# Patient Record
Sex: Female | Born: 2000 | Hispanic: No | Marital: Single | Smoking: Never smoker
Health system: Southern US, Community
[De-identification: ages and names within clinical notes are randomized; demographics above are authoritative.]

## PROBLEM LIST (undated history)

## (undated) DIAGNOSIS — G43909 Migraine, unspecified, not intractable, without status migrainosus: Secondary | ICD-10-CM

## (undated) HISTORY — DX: Migraine, unspecified, not intractable, without status migrainosus: G43.909

---
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
## 2021-04-27 ENCOUNTER — Ambulatory Visit (INDEPENDENT_AMBULATORY_CARE_PROVIDER_SITE_OTHER): Payer: Medicaid Other | Admitting: Family

## 2021-04-27 ENCOUNTER — Other Ambulatory Visit: Payer: Self-pay

## 2021-04-27 ENCOUNTER — Encounter (INDEPENDENT_AMBULATORY_CARE_PROVIDER_SITE_OTHER): Payer: Self-pay | Admitting: Family

## 2021-04-27 VITALS — BP 102/63 | HR 86 | Wt 117.2 lb

## 2021-04-27 DIAGNOSIS — F411 Generalized anxiety disorder: Secondary | ICD-10-CM | POA: Diagnosis not present

## 2021-04-27 DIAGNOSIS — G44219 Episodic tension-type headache, not intractable: Secondary | ICD-10-CM | POA: Diagnosis not present

## 2021-04-27 DIAGNOSIS — R519 Headache, unspecified: Secondary | ICD-10-CM

## 2021-04-27 DIAGNOSIS — G43009 Migraine without aura, not intractable, without status migrainosus: Secondary | ICD-10-CM

## 2021-04-27 MED ORDER — TIZANIDINE HCL 2 MG PO TABS: ORAL_TABLET | ORAL | 5 refills | Status: AC

## 2021-04-27 MED ORDER — TROKENDI XR 100 MG PO CP24
ORAL_CAPSULE | ORAL | 5 refills | Status: AC
Start: 2021-04-27 — End: ?

## 2021-04-27 MED ORDER — RIZATRIPTAN BENZOATE 10 MG PO TABS: 10.0000 mg | ORAL_TABLET | ORAL | 5 refills | Status: AC | PRN

## 2021-04-29 ENCOUNTER — Encounter (INDEPENDENT_AMBULATORY_CARE_PROVIDER_SITE_OTHER): Payer: Self-pay | Admitting: Family

## 2021-04-29 NOTE — Progress Notes (Signed)
Erica Duran   MRN:  [id number]  07-07-2001   Provider: Elveria Rising NP-C Location of Care: [facility]  Visit type: Return visit  Last visit: 12/15/20  Referral source: Marcos Eke, PA-C History from: patient  Brief history:  Copied from previous record: History of migraine and tension headaches, orthostatic hypotension, situational depression. She is taking and tolerating Trokendi XR for migraine prevention and has experienced improvement in migraine frequency. She takes Ibuprofen and Rizatriptan for migraine relief. She has been seeing a therapist for mood.   Today's concerns: Erica Duran reports today that she has experienced fewer headaches since her last visit. She has enrolled in online college classes at [employer] and is doing well academically. She has not experienced dizziness and fainting since she has stopped drinking energy drinks. She has been working at consuming more water and regular meals, and says that she is sleeping well.   Erica Duran has been otherwise generally healthy since she was last seen. She has no other health concerns today other than previously mentioned.  Review of systems: Please see HPI for neurologic and other pertinent review of systems. Otherwise all other systems were reviewed and were negative.  Problem List: Patient Active Problem List   Diagnosis Date Noted   Nonintractable headache 12/18/2020   Anxiety disorder, unspecified 10/29/2019   Situational depression 09/05/2018   Orthostatic hypotension 03/04/2018   Migraine without aura and without status migrainosus, not intractable 08/11/2015   Episodic tension type headache 08/11/2015   Family history of migraine 08/11/2015     Past Medical History:  Diagnosis Date   Migraines     Past medical history comments: See HPI Copied from previous record: Birth History 5 lbs. 11 oz. infant born at [redacted] weeks gestational age to a 19 year old g 2 p 1 0 0 1 female. Gestation was  uncomplicated Mother received Epidural anesthesia   Repeat cesarean section Nursery Course was complicated by difficulty maintaining temperature Growth and Development was recalled as normal  Surgical history: No past surgical history on file.   Family history: family history includes Breast cancer in her maternal aunt and mother; Cancer in her mother; Diabetes in her maternal grandfather and maternal grandmother; Hyperlipidemia in her maternal grandmother; Liver disease in her maternal grandfather; Ovarian cancer in her maternal aunt; Rheum arthritis in her maternal grandmother; Thyroid disease in her maternal grandmother and mother.   Social history: Social History   Socioeconomic History   Marital status: Single    Spouse name: Not on file   Number of children: Not on file   Years of education: Not on file   Highest education level: Not on file  Occupational History   Not on file  Tobacco Use   Smoking status: Never    Passive exposure: Yes   Smokeless tobacco: Never   Tobacco comments:    Smoking inside and outside of home.  Vaping Use   Vaping Use: Never used  Substance and Sexual Activity   Alcohol use: No    Alcohol/week: 0.0 standard drinks   Drug use: No   Sexual activity: Never    Birth control/protection: I.U.D.    Comment: Mirena Inserted 2018  Other Topics Concern   Not on file  Social History Narrative   W[place]ene is not currently in school and is not employed.   She lives with her mother, her aunt, and she visits with father at-least once a week depending on his work schedule.    She enjoys  reading, sewing, and basketball.   Social Determinants of Health   Financial Resource Strain: Not on file  Food Insecurity: Not on file  Transportation Needs: Not on file  Physical Activity: Not on file  Stress: Not on file  Social Connections: Not on file  Intimate Partner Violence: Not on file    Past/failed meds: Copied from previous record: Sumatriptan did  not give relief of migraines   Allergies: No Known Allergies   Immunizations: Immunization History  Administered Date(s) Administered   PFIZER(Purple Top)SARS-COV-2 Vaccination 03/12/2020, 04/02/2020    Diagnostics/Screenings:  Physical Exam: BP 102/63   Pulse 86   Wt 117 lb 3.2 oz (53.2 kg)   BMI 18.77 kg/m   General: Well developed, well nourished, seated, in no evident distress Head: Head normocephalic and atraumatic.  Oropharynx benign. Neck: Supple Cardiovascular: Regular rate and rhythm, no murmurs Respiratory: Breath sounds clear to auscultation Musculoskeletal: No obvious deformities or scoliosis Skin: No rashes or neurocutaneous lesions  Neurologic Exam Mental Status: Awake and fully alert.  Oriented to place and time.  Recent and remote memory intact.  Attention span, concentration, and fund of knowledge appropriate.  Mood and affect appropriate. Cranial Nerves: Fundoscopic exam reveals sharp disc margins.  Pupils equal, briskly reactive to light.  Extraocular movements full without nystagmus.  Visual fields full to confrontation.  Hearing intact and symmetric to finger rub.  Facial sensation intact.  Face tongue, palate move normally and symmetrically.  Neck flexion and extension normal. Motor: Normal bulk and tone. Normal strength in all tested extremity muscles. Sensory: Intact to touch and temperature in all extremities.  Coordination: Rapid alternating movements normal in all extremities.  Finger-to-nose and heel-to shin performed accurately bilaterally.  Romberg negative. Gait and Station: Arises from chair without difficulty.  Stance is normal. Gait demonstrates normal stride length and balance.   Able to heel, toe and tandem walk without difficulty. Reflexes: 1+ and symmetric. Toes downgoing.   Impression: Migraine without aura and without status migrainosus, not intractable - Plan: TROKENDI XR 100 MG CP24, tiZANidine (ZANAFLEX) 2 MG tablet, rizatriptan (MAXALT)  10 MG tablet  Nonintractable headache, unspecified chronicity pattern, unspecified headache type - Plan: tiZANidine (ZANAFLEX) 2 MG tablet  Episodic tension-type headache, not intractable  Generalized anxiety disorder   Recommendations for plan of care: The patient's previous [facility] were reviewed. Ceonna has neither had nor required imaging or lab studies since the last visit. She is taking and tolerating Trokendi XR for migraine prevention and is doing well at this time. I reminded Erica Duran of the need for her to drink plenty of water each day, to consume regular meals and to get at least 8 hours of sleep each night as these things are known to help reduce how often headaches occur. I will see Erica Duran back in follow up in 6 months or sooner if needed.   The medication list was reviewed and reconciled. No changes were made in the prescribed medications today. A complete medication list was provided to the patient.  Return in about 6 months (around 10/25/2021).   Allergies as of 04/27/2021   No Known Allergies      Medication List        Accurate as of April 27, 2021 11:59 PM. If you have any questions, ask your nurse or doctor.          levonorgestrel 20 MCG/24HR IUD Commonly known as: MIRENA 1 each by Intrauterine route once.   ondansetron 4 MG disintegrating tablet Commonly known  as: Zofran ODT Take 1 tablet at onset of nausea. May repeat every 8 hours as needed   promethazine 12.5 MG tablet Commonly known as: PHENERGAN Take 1 tablet at onset of nausea. May repeat every 6 hours as needed   rizatriptan 10 MG tablet Commonly known as: Maxalt Take 1 tablet (10 mg total) by mouth as needed for migraine. May repeat in 2 hours if needed   tiZANidine 2 MG tablet Commonly known as: ZANAFLEX TAKE 1 TABLET BY MOUTH AT BEDTIME FOR PAIN THAT  KEEPS  YOU  AWAKE   Trokendi XR 100 MG Cp24 Generic drug: Topiramate ER Take 1 capsule at bedtime        Total time spent  with the patient was 20 minutes, of which 50% or more was spent in counseling and coordination of care.  Elveria Rising NP-C [facility] Ph. [phone] Fax [phone]

## 2021-04-29 NOTE — Patient Instructions (Signed)
Thank you for coming in today.   Instructions for you until your next appointment are as follows: Continue taking the Trokendi XR as prescribed Remember that it is important for you to drink plenty of water each day, to not skip meals and to get at least 8 hours of sleep each night as these things are known to help reduce how often headaches occur Let me know if the headaches become more frequent or more severe Please sign up for MyChart if you have not done so. Please plan to return for follow up in 6 months or sooner if needed.  At [facility], we are committed to providing exceptional care. You will receive a patient satisfaction survey through text or email regarding your visit today. Your opinion is important to me. Comments are appreciated.

## 2021-10-26 ENCOUNTER — Ambulatory Visit (INDEPENDENT_AMBULATORY_CARE_PROVIDER_SITE_OTHER): Payer: Medicaid Other | Admitting: Family

## 2021-10-26 ENCOUNTER — Encounter (INDEPENDENT_AMBULATORY_CARE_PROVIDER_SITE_OTHER): Payer: Self-pay | Admitting: Family

## 2023-03-27 ENCOUNTER — Telehealth (INDEPENDENT_AMBULATORY_CARE_PROVIDER_SITE_OTHER): Payer: Self-pay | Admitting: Family

## 2023-03-27 NOTE — Telephone Encounter (Signed)
Kimmi dropped of forms for her job to be filled out by provider. She would like a callback once forms are ready to be picked up at [phone].

## 2023-03-27 NOTE — Telephone Encounter (Signed)
Forms were placed in Erica Duran's mailbox.

## 2023-03-27 NOTE — Telephone Encounter (Signed)
Contacted patient to inform her that we are unable to complete this paperwork due to her not being seen in almost two years.   Patient stated that she really needs to paperwork to be completed so that she wont lose her job.   Patient also stated that she has scheduled an appointment for 10.31.2024 and would ike to know if she can be seen sooner.   SS, CCMA

## 2023-03-28 NOTE — Telephone Encounter (Signed)
I called and spoke with Erica Duran. She is working at [employer] and needs an Transport planner completed because of time missed due to migraines. I will be happy to work her into an appointment to get the form completed but at this time, she does not know her schedule after tomorrow. She will call me on Friday to let me know her work schedule and I will work out a time with her then. TG

## 2023-03-28 NOTE — Telephone Encounter (Signed)
I called and left a message for the patient. I will call her back later today. TG

## 2023-05-30 NOTE — Progress Notes (Deleted)
Erica Duran   MRN:  [id number]  [birth year]   Provider: Elveria Rising NP-C Location of Care: [facility] and [facility]  Visit type: Return visit   Last visit: 04/27/2021  Referral source: Serita Grit, PA-C History from: Epic chart ***  Brief history:  Copied from previous record: History of migraine and tension headaches, orthostatic hypotension, situational depression. She is taking and tolerating Trokendi XR for migraine prevention and has experienced improvement in migraine frequency. She takes Ibuprofen and Rizatriptan for migraine relief. She has been seeing a therapist for mood.     Today's concerns: She  Erica Duran has been otherwise generally healthy since she was last seen. No health concerns today other than previously mentioned.  Review of systems: Please see HPI for neurologic and other pertinent review of systems. Otherwise all other systems were reviewed and were negative.  Problem List: Patient Active Problem List   Diagnosis Date Noted   Nonintractable headache 12/18/2020   Anxiety disorder, unspecified 10/29/2019   Situational depression 09/05/2018   Orthostatic hypotension 03/04/2018   Migraine without aura and without status migrainosus, not intractable 08/11/2015   Episodic tension type headache 08/11/2015   Family history of migraine 08/11/2015     Past Medical History:  Diagnosis Date   Migraines     Past medical history comments: See HPI Copied from previous record: Birth History 5 lbs. 11 oz. infant born at [redacted] weeks gestational age to a 21 year old g 2 p 1 0 0 1 female. Gestation was uncomplicated Mother received Epidural anesthesia   Repeat cesarean section Nursery Course was complicated by difficulty maintaining temperature Growth and Development was recalled as normal  Surgical history: No past surgical history on file.   Family history: family history includes Breast cancer in her  maternal aunt and mother; Cancer in her mother; Diabetes in her maternal grandfather and maternal grandmother; Hyperlipidemia in her maternal grandmother; Liver disease in her maternal grandfather; Ovarian cancer in her maternal aunt; Rheum arthritis in her maternal grandmother; Thyroid disease in her maternal grandmother and mother.   Social history: Social History   Socioeconomic History   Marital status: Single    Spouse name: Not on file   Number of children: Not on file   Years of education: Not on file   Highest education level: Not on file  Occupational History   Not on file  Tobacco Use   Smoking status: Never    Passive exposure: Yes   Smokeless tobacco: Never   Tobacco comments:    Smoking inside and outside of home.  Vaping Use   Vaping status: Never Used  Substance and Sexual Activity   Alcohol use: No    Alcohol/week: 0.0 standard drinks of alcohol   Drug use: No   Sexual activity: Never    Birth control/protection: I.U.D.    Comment: Mirena Inserted 2018  Other Topics Concern   Not on file  Social History Narrative   Erica Duran is not currently in school and is not employed.   She lives with her mother, her aunt, and she visits with father at-least once a week depending on his work schedule.    She enjoys reading, sewing, and basketball.   Social Determinants of Health   Financial Resource Strain: Not on file  Food Insecurity: Not on file  Transportation Needs: Not on file  Physical Activity: Not on file  Stress: Not on file  Social Connections: Not on file  Intimate Partner  Violence: Not on file    Past/failed meds: Copied from previous record: Sumatriptan did not give relief of migraines   Allergies: No Known Allergies   Immunizations: Immunization History  Administered Date(s) Administered   PFIZER(Purple Top)SARS-COV-2 Vaccination 03/12/2020, 04/02/2020    Diagnostics/Screenings:  Physical Exam: There were no vitals taken for this visit.   General: Well developed, well nourished, seated, in no evident distress Head: Head normocephalic and atraumatic.  Oropharynx benign. Neck: Supple Cardiovascular: Regular rate and rhythm, no murmurs Respiratory: Breath sounds clear to auscultation Musculoskeletal: No obvious deformities or scoliosis Skin: No rashes or neurocutaneous lesions  Neurologic Exam Mental Status: Awake and fully alert.  Oriented to place and time.  Recent and remote memory intact.  Attention span, concentration, and fund of knowledge appropriate.  Mood and affect appropriate. Cranial Nerves: Fundoscopic exam reveals sharp disc margins.  Pupils equal, briskly reactive to light.  Extraocular movements full without nystagmus. Hearing intact and symmetric to whisper.  Facial sensation intact.  Face tongue, palate move normally and symmetrically. Shoulder shrug normal Motor: Normal bulk and tone. Normal strength in all tested extremity muscles. Sensory: Intact to touch and temperature in all extremities.  Coordination: Rapid alternating movements normal in all extremities.  Finger-to-nose and heel-to shin performed accurately bilaterally.  Romberg negative. Gait and Station: Arises from chair without difficulty.  Stance is normal. Gait demonstrates normal stride length and balance.   Able to heel, toe and tandem walk without difficulty. Reflexes: 1+ and symmetric. Toes downgoing.   Impression: No diagnosis found.    Recommendations for plan of care: The patient's previous Epic records were reviewed. No recent diagnostic studies to be reviewed with the patient.  Plan until next visit: Continue medications as prescribed  Call for questions or concerns No follow-ups on file.  The medication list was reviewed and reconciled. No changes were made in the prescribed medications today. A complete medication list was provided to the patient.  No orders of the defined types were placed in this encounter.    Allergies as  of 05/31/2023   No Known Allergies      Medication List        Accurate as of May 30, 2023  8:07 PM. If you have any questions, ask your nurse or doctor.          levonorgestrel 20 MCG/24HR IUD Commonly known as: MIRENA 1 each by Intrauterine route once.   ondansetron 4 MG disintegrating tablet Commonly known as: Zofran ODT Take 1 tablet at onset of nausea. May repeat every 8 hours as needed   promethazine 12.5 MG tablet Commonly known as: PHENERGAN Take 1 tablet at onset of nausea. May repeat every 6 hours as needed   rizatriptan 10 MG tablet Commonly known as: Maxalt Take 1 tablet (10 mg total) by mouth as needed for migraine. May repeat in 2 hours if needed   tiZANidine 2 MG tablet Commonly known as: ZANAFLEX TAKE 1 TABLET BY MOUTH AT BEDTIME FOR PAIN THAT  KEEPS  YOU  AWAKE   Trokendi XR 100 MG Cp24 Generic drug: Topiramate ER Take 1 capsule at bedtime            I discussed this patient's care with the multiple providers involved in her care today to develop this assessment and plan.   Total time spent with the patient was *** minutes, of which 50% or more was spent in counseling and coordination of care.  Elveria Rising NP-C [facility] [address] [place], [place] [address] Ph. [phone] Fax [phone]

## 2023-05-31 ENCOUNTER — Ambulatory Visit (INDEPENDENT_AMBULATORY_CARE_PROVIDER_SITE_OTHER): Payer: Medicaid Other | Admitting: Family
